# Patient Record
Sex: Male | Born: 1995 | Race: White | Hispanic: No | Marital: Single | Smoking: Never smoker
Health system: Southern US, Community
[De-identification: ages and names within clinical notes are randomized; demographics above are authoritative.]

## PROBLEM LIST (undated history)

## (undated) DIAGNOSIS — K4090 Unilateral inguinal hernia, without obstruction or gangrene, not specified as recurrent: Secondary | ICD-10-CM

## (undated) DIAGNOSIS — H539 Unspecified visual disturbance: Secondary | ICD-10-CM

## (undated) DIAGNOSIS — E7800 Pure hypercholesterolemia, unspecified: Secondary | ICD-10-CM

## (undated) DIAGNOSIS — T7840XA Allergy, unspecified, initial encounter: Secondary | ICD-10-CM

## (undated) DIAGNOSIS — K219 Gastro-esophageal reflux disease without esophagitis: Secondary | ICD-10-CM

---
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
## 2020-09-18 ENCOUNTER — Encounter: Payer: Self-pay | Admitting: Emergency Medicine

## 2020-09-18 ENCOUNTER — Other Ambulatory Visit: Payer: Self-pay

## 2020-09-18 ENCOUNTER — Ambulatory Visit
Admission: EM | Admit: 2020-09-18 | Discharge: 2020-09-18 | Disposition: A | Discharge status: Home / Self Care | Payer: BC Managed Care – PPO | Attending: Physician Assistant | Admitting: Physician Assistant

## 2020-09-18 ENCOUNTER — Ambulatory Visit (INDEPENDENT_AMBULATORY_CARE_PROVIDER_SITE_OTHER): Payer: BC Managed Care – PPO

## 2020-09-18 DIAGNOSIS — U071 COVID-19: Secondary | ICD-10-CM

## 2020-09-18 DIAGNOSIS — R059 Cough, unspecified: Secondary | ICD-10-CM | POA: Diagnosis not present

## 2020-09-18 DIAGNOSIS — R0602 Shortness of breath: Secondary | ICD-10-CM | POA: Diagnosis not present

## 2020-09-18 MED ORDER — ALBUTEROL SULFATE HFA 108 (90 BASE) MCG/ACT IN AERS: 1.0000 | INHALATION_SPRAY | Freq: Four times a day (QID) | RESPIRATORY_TRACT | 0 refills | Status: AC | PRN

## 2020-09-18 NOTE — ED Provider Notes (Signed)
[facility]    CSN: [id number] Arrival date & time: 09/18/20  1011      History   Chief Complaint Chief Complaint  Patient presents with  . Cough  . Covid Positive  . Shortness of Breath    HPI Christopher George is a 24 y.o. male (COVID unvaccinated) with confirmed Covid diagnosis on 09/11/2020.  Patient stated that he had been ill for "a couple of days" at that point.  Symptom onset estimated to be around 09/09/2020, which was 9 days ago.  Symptoms include cough and sensation or feeling of shortness of breath.  Patient also admits to fever up to 101 yesterday and today and states that he took ibuprofen before arrival to clinic.  He had been prescribed azithromycin, prednisone and Tessalon Perles at his initial office visit with his PCP when the diagnosis of Covid was made.  He states he took all the medication and still has symptoms.  Patient denies worsening of condition states he actually feels little bit better.  Patient does not have any cardiopulmonary disease or immunocompromising conditions.  He is healthy with medical history of anxiety and depression.  He currently takes Seroquel, Cymbalta and clonazepam.  Patient does not have any other complaints or concerns today.  HPI  Past Medical History:  Diagnosis Date  . Acid reflux    occasional  . Allergy   . High cholesterol    no current med.  . Inguinal hernia 11/2014   right  . Vision abnormalities    wears glasses    There are no problems to display for this patient.   Past Surgical History:  Procedure Laterality Date  . INGUINAL HERNIA REPAIR Right 12/05/2014   Procedure: RIGHT INGUINAL HERNIA REPAIR PEDIATRIC;  Surgeon: Judie Petit. Leonia Corona, MD;  Location: [facility];  Service: Pediatrics;  Laterality: Right;       Home Medications    Prior to Admission medications   Medication Sig Start Date End Date Taking? Authorizing Provider  clonazePAM (KLONOPIN) 0.5 MG tablet Take 0.5 mg by  mouth daily as needed. 09/18/20  Yes [provider]  QUEtiapine (SEROQUEL) 100 MG tablet Take 100 mg by mouth at bedtime. 08/19/20  Yes [provider]  HYDROcodone-acetaminophen (NORCO/VICODIN) 5-325 MG per tablet Take 1 tablet by mouth every 6 (six) hours as needed for moderate pain. 12/05/14   Leonia Corona, MD    Family History Family History  Problem Relation Age of Onset  . Heart disease Paternal Grandfather   . Antithrombin III deficiency Paternal Grandfather   . Hypertension Father   . Healthy Mother     Social History Social History   Tobacco Use  . Smoking status: Never Smoker  . Smokeless tobacco: Never Used  Vaping Use  . Vaping Use: Never used  Substance Use Topics  . Alcohol use: No  . Drug use: No     Allergies   Patient has no known allergies.   Review of Systems Review of Systems  Constitutional: Positive for fever. Negative for fatigue.  HENT: Positive for congestion. Negative for rhinorrhea, sinus pressure, sinus pain and sore throat.   Respiratory: Positive for cough and shortness of breath.   Gastrointestinal: Negative for abdominal pain, diarrhea, nausea and vomiting.  Musculoskeletal: Negative for myalgias.  Neurological: Negative for weakness, light-headedness and headaches.  Hematological: Negative for adenopathy.     Physical Exam Triage Vital Signs ED Triage Vitals [09/18/20 1037]  Enc Vitals Group  BP 138/73     Pulse Rate 84     Resp 18     Temp 98.2 F (36.8 C)     Temp Source Oral     SpO2 100 %     Weight 165 lb (74.8 kg)     Height 5\' 9"  (1.753 m)     Head Circumference      Peak Flow      Pain Score 0     Pain Loc      Pain Edu?      Excl. in GC?    No data found.  Updated Vital Signs BP 138/73 (BP Location: Left Arm)   Pulse 84   Temp 98.2 F (36.8 C) (Oral)   Resp 18   Ht 5\' 9"  (1.753 m)   Wt 165 lb (74.8 kg)   SpO2 100%   BMI 24.37 kg/m       Physical Exam Vitals and nursing  note reviewed.  Constitutional:      General: He is not in acute distress.    Appearance: Normal appearance. He is well-developed. He is not ill-appearing or toxic-appearing.  HENT:     Head: Normocephalic and atraumatic.     Nose: Nose normal.     Mouth/Throat:     Mouth: Mucous membranes are moist.     Pharynx: Oropharynx is clear.  Eyes:     General: No scleral icterus.    Conjunctiva/sclera: Conjunctivae normal.  Cardiovascular:     Rate and Rhythm: Normal rate and regular rhythm.     Heart sounds: Normal heart sounds.  Pulmonary:     Effort: Pulmonary effort is normal. No respiratory distress.     Breath sounds: Normal breath sounds. No wheezing, rhonchi or rales.  Musculoskeletal:     Cervical back: Neck supple.  Skin:    General: Skin is warm and dry.  Neurological:     General: No focal deficit present.     Mental Status: He is alert. Mental status is at baseline.     Motor: No weakness.     Gait: Gait normal.  Psychiatric:        Mood and Affect: Mood normal.        Behavior: Behavior normal.        Thought Content: Thought content normal.      UC Treatments / Results  Labs (all labs ordered are listed, but only abnormal results are displayed) Labs Reviewed - No data to display  EKG   Radiology DG Chest 2 View  Result Date: 09/18/2020 CLINICAL DATA:  Cough and shortness of breath. COVID-19 positive. EXAM: CHEST - 2 VIEW COMPARISON:  06/06/2008. FINDINGS: Normal sized heart. Clear lungs. Stable minimal peribronchial thickening. Normal appearing bones. IMPRESSION: Stable minimal bronchitic changes. Electronically Signed   By: Beckie SaltsSteven  Reid M.D.   On: 09/18/2020 10:55    Procedures Procedures (including critical care time)  Medications Ordered in UC Medications - No data to display  Initial Impression / Assessment and Plan / UC Course  I have reviewed the triage vital signs and the nursing notes.  Pertinent labs & imaging results that were available  during my care of the patient were reviewed by me and considered in my medical decision making (see chart for details).   Reviewed previous office notes from 09/11/2020 on patient tested positive for Covid.  All vital signs are stable today.  He is afebrile.  His chest is clear to auscultation.  He is not in  any acute respiratory distress.  Oxygen is 100%.  Chest x-ray obtained today due to patient concerns.  Chest x-ray does not reveal any signs of pneumonia.  There is some peribronchial thickening.  Reviewed chest x-ray myself.  Discussed results of imaging with patient and advised him that it may take another week or 2 for him to have resolved symptoms.  Advised him to continue Mucinex and Tessalon Perles if needed.  Advised increasing rest and fluid intake.  You may do the albuterol inhaler as needed for any shortness of breath.  ED precautions discussed.  Follow-up with PCP otherwise.   Final Clinical Impressions(s) / UC Diagnoses   Final diagnoses:  COVID-19  Cough  Shortness of breath     Discharge Instructions     You need to isolate another 2 days for Covid infection.  We did take a chest x-ray today which does not show any signs of pneumonia.  Use the inhaler as needed for any shortness of breath.  Purchase over-the-counter Mucinex and increase fluid intake.  Go to emergency room for any fever, worsening cough or worsening breathing difficulty.  And still may take another week or 2 for you to recover.  Sometimes, it takes longer than that.  However, if any symptoms worsen then he should be seen again, otherwise you should get better with some more time.  COVID INFO:  If symptomatic, go home and rest. Push fluids. Take Tylenol as needed for discomfort. Gargle warm salt water. Throat lozenges. Take Mucinex DM or Robitussin for cough. Humidifier in bedroom to ease coughing. Warm showers. Also review the COVID handout for more information.  COVID-19 INFECTION: The incubation period  of COVID-19 is approximately 14 days after exposure, with most symptoms developing in roughly 4-5 days. Symptoms may range in severity from mild to critically severe. Roughly 80% of those infected will have mild symptoms. People of any age may become infected with COVID-19 and have the ability to transmit the virus. The most common symptoms include: fever, fatigue, cough, body aches, headaches, sore throat, nasal congestion, shortness of breath, nausea, vomiting, diarrhea, changes in smell and/or taste.    COURSE OF ILLNESS Some patients may begin with mild disease which can progress quickly into critical symptoms. If your symptoms are worsening please call ahead to the Emergency Department and proceed there for further treatment. Recovery time appears to be roughly 1-2 weeks for mild symptoms and 3-6 weeks for severe disease.   GO IMMEDIATELY TO ER FOR FEVER YOU ARE UNABLE TO GET DOWN WITH TYLENOL, BREATHING PROBLEMS, CHEST PAIN, FATIGUE, LETHARGY, INABILITY TO EAT OR DRINK, ETC  QUARANTINE AND ISOLATION: To help decrease the spread of COVID-19 please remain isolated if you have COVID infection or are highly suspected to have COVID infection. This means -stay home and isolate to one room in the home if you live with others. Do not share a bed or bathroom with others while ill, sanitize and wipe down all countertops and keep common areas clean and disinfected. You may discontinue isolation if you have a mild case and are asymptomatic 10 days after symptom onset as long as you have been fever free >24 hours without having to take Motrin or Tylenol. If your case is more severe (meaning you develop pneumonia or are admitted in the hospital), you may have to isolate longer.   If you have been in close contact (within 6 feet) of someone diagnosed with COVID 19, you are advised to quarantine in your home for  14 days as symptoms can develop anywhere from 2-14 days after exposure to the virus. If you develop  symptoms, you  must isolate.  Most current guidelines for COVID after exposure -isolate 10 days if you ARE NOT tested for COVID as long as symptoms do not develop -isolate 7 days if you are tested and remain asymptomatic -You do not necessarily need to be tested for COVID if you have + exposure and        develop   symptoms. Just isolate at home x10 days from symptom onset During this global pandemic, CDC advises to practice social distancing, try to stay at least 19ft away from others at all times. Wear a face covering. Wash and sanitize your hands regularly and avoid going anywhere that is not necessary.  KEEP IN MIND THAT THE COVID TEST IS NOT 100% ACCURATE AND YOU SHOULD STILL DO EVERYTHING TO PREVENT POTENTIAL SPREAD OF VIRUS TO OTHERS (WEAR MASK, WEAR GLOVES, WASH HANDS AND SANITIZE REGULARLY). IF INITIAL TEST IS NEGATIVE, THIS MAY NOT MEAN YOU ARE DEFINITELY NEGATIVE. MOST ACCURATE TESTING IS DONE 5-7 DAYS AFTER EXPOSURE.   It is not advised by CDC to get re-tested after receiving a positive COVID test since you can still test positive for weeks to months after you have already cleared the virus.   *If you have not been vaccinated for COVID, I strongly suggest you consider getting vaccinated as long as there are no contraindications.      ED Prescriptions    None     PDMP not reviewed this encounter.   Shirlee Latch, PA-C 09/18/20 1115

## 2020-09-18 NOTE — Discharge Instructions (Addendum)
You need to isolate another 2 days for Covid infection.  We did take a chest x-ray today which does not show any signs of pneumonia.  Use the inhaler as needed for any shortness of breath.  Purchase over-the-counter Mucinex and increase fluid intake.  Go to emergency room for any fever, worsening cough or worsening breathing difficulty.  And still may take another week or 2 for you to recover.  Sometimes, it takes longer than that.  However, if any symptoms worsen then he should be seen again, otherwise you should get better with some more time.  COVID INFO:  If symptomatic, go home and rest. Push fluids. Take Tylenol as needed for discomfort. Gargle warm salt water. Throat lozenges. Take Mucinex DM or Robitussin for cough. Humidifier in bedroom to ease coughing. Warm showers. Also review the COVID handout for more information.  COVID-19 INFECTION: The incubation period of COVID-19 is approximately 14 days after exposure, with most symptoms developing in roughly 4-5 days. Symptoms may range in severity from mild to critically severe. Roughly 80% of those infected will have mild symptoms. People of any age may become infected with COVID-19 and have the ability to transmit the virus. The most common symptoms include: fever, fatigue, cough, body aches, headaches, sore throat, nasal congestion, shortness of breath, nausea, vomiting, diarrhea, changes in smell and/or taste.    COURSE OF ILLNESS Some patients may begin with mild disease which can progress quickly into critical symptoms. If your symptoms are worsening please call ahead to the Emergency Department and proceed there for further treatment. Recovery time appears to be roughly 1-2 weeks for mild symptoms and 3-6 weeks for severe disease.   GO IMMEDIATELY TO ER FOR FEVER YOU ARE UNABLE TO GET DOWN WITH TYLENOL, BREATHING PROBLEMS, CHEST PAIN, FATIGUE, LETHARGY, INABILITY TO EAT OR DRINK, ETC  QUARANTINE AND ISOLATION: To help decrease the spread of  COVID-19 please remain isolated if you have COVID infection or are highly suspected to have COVID infection. This means -stay home and isolate to one room in the home if you live with others. Do not share a bed or bathroom with others while ill, sanitize and wipe down all countertops and keep common areas clean and disinfected. You may discontinue isolation if you have a mild case and are asymptomatic 10 days after symptom onset as long as you have been fever free >24 hours without having to take Motrin or Tylenol. If your case is more severe (meaning you develop pneumonia or are admitted in the hospital), you may have to isolate longer.   If you have been in close contact (within 6 feet) of someone diagnosed with COVID 19, you are advised to quarantine in your home for 14 days as symptoms can develop anywhere from 2-14 days after exposure to the virus. If you develop symptoms, you  must isolate.  Most current guidelines for COVID after exposure -isolate 10 days if you ARE NOT tested for COVID as long as symptoms do not develop -isolate 7 days if you are tested and remain asymptomatic -You do not necessarily need to be tested for COVID if you have + exposure and        develop   symptoms. Just isolate at home x10 days from symptom onset During this global pandemic, CDC advises to practice social distancing, try to stay at least 32ft away from others at all times. Wear a face covering. Wash and sanitize your hands regularly and avoid going anywhere that is not  necessary.  KEEP IN MIND THAT THE COVID TEST IS NOT 100% ACCURATE AND YOU SHOULD STILL DO EVERYTHING TO PREVENT POTENTIAL SPREAD OF VIRUS TO OTHERS (WEAR MASK, WEAR GLOVES, WASH HANDS AND SANITIZE REGULARLY). IF INITIAL TEST IS NEGATIVE, THIS MAY NOT MEAN YOU ARE DEFINITELY NEGATIVE. MOST ACCURATE TESTING IS DONE 5-7 DAYS AFTER EXPOSURE.   It is not advised by CDC to get re-tested after receiving a positive COVID test since you can still test positive  for weeks to months after you have already cleared the virus.   *If you have not been vaccinated for COVID, I strongly suggest you consider getting vaccinated as long as there are no contraindications.

## 2020-09-18 NOTE — ED Triage Notes (Signed)
Patient in today c/o cough and sob. Patient tested positive for covid on 09/11/20. Patient seen at [facility] on 09/11/20 and had a positive rapid covid test. Patient was treated for bronchitis with a Zpak, Prednisone and Tessalon Perles. Patient did not have a CXR at that time.

## 2021-06-03 ENCOUNTER — Ambulatory Visit: Payer: BC Managed Care – PPO | Admitting: Behavioral Health

## 2021-06-03 ENCOUNTER — Other Ambulatory Visit: Payer: Self-pay

## 2021-06-03 ENCOUNTER — Encounter: Payer: Self-pay | Admitting: Behavioral Health

## 2021-06-03 VITALS — BP 121/76 | HR 65 | Ht 68.0 in | Wt 156.0 lb

## 2021-06-03 DIAGNOSIS — F331 Major depressive disorder, recurrent, moderate: Secondary | ICD-10-CM | POA: Diagnosis not present

## 2021-06-03 DIAGNOSIS — F39 Unspecified mood [affective] disorder: Secondary | ICD-10-CM

## 2021-06-03 DIAGNOSIS — F411 Generalized anxiety disorder: Secondary | ICD-10-CM | POA: Diagnosis not present

## 2021-06-03 MED ORDER — LAMOTRIGINE 25 MG PO TABS: ORAL_TABLET | ORAL | 1 refills | Status: AC

## 2021-06-03 MED ORDER — SERTRALINE HCL 100 MG PO TABS: 100.0000 mg | ORAL_TABLET | Freq: Every day | ORAL | 1 refills | Status: DC

## 2021-06-03 NOTE — Progress Notes (Signed)
[facility] MD/PA/NP Initial Note  06/03/2021 12:43 PM SAUNDERS ARLINGTON  MRN:  [id number]  Chief Complaint:  Chief Complaint   Anxiety; Depression; Establish Care; Medication Problem; Medication Refill; Fatigue     HPI:  25 year old male presents to this office for initial visit and to establish care. He says that for the last 3-4 years has experienced lack of motivation, fatigue, anxiety, and depression. Says that he has bounced between providers and specialist and changed medications many times for these symptoms. Says he has seen PCP, Psychiatrist, Tele-psychiatry, cardiology, and neurology.  He says that he has been diagnosed with chronic nerve pain and is currently on high doses of gabapentin. He also d frequently takes multiple doses of Klonopin concurrently with gabapentin. Says his PCP restarted Zoloft 50 mg because he has switched around on so many medications many times without adequate time frames to see if medication worked. Pt acknowledges that he sometimes would stop medications without guidance from clinician.  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 Worries about the "brain fog" and reduced memory problems.  Says he his always fatigued and has trouble concentrating. Says he feels his moods, "kinda all over the place". He endorses frequent irritability, and racing thoughts.  Says that he has had periods where he was impulsive. Recently says he made purchase of new car when he had his current car paid off, and he really did not need it. Says that at same time was co-purchasing another car with Fiance. Says that he has experience mood swings with highs and lows but feels he stays mostly depressed. Reports anxiety today at 5/10 and depression at 4/10. Says he does sleeps 7-8 hours with aid of his medication. No mania present at this time. No psychosis. No SI/HI  Past Psychiatric Medication  trials:  Wellbutrin Sertraline Dextroamphetamine Strattera Duloxetine Seroquel Abilify Amoxetine Depakote Hydroxyzine Prozac Temazepam  Buspar      Visit Diagnosis:    ICD-10-CM   1. Generalized anxiety disorder  F41.1 lamoTRIgine (LAMICTAL) 25 MG tablet    sertraline (ZOLOFT) 100 MG tablet    2. Major depressive disorder, recurrent episode, moderate (HCC)  F33.1 lamoTRIgine (LAMICTAL) 25 MG tablet    sertraline (ZOLOFT) 100 MG tablet    3. Unspecified mood (affective) disorder (HCC)  F39 lamoTRIgine (LAMICTAL) 25 MG tablet      Past Psychiatric History: Multiple providers over last 3 years. See note  Past Medical History:  Past Medical History:  Diagnosis Date   Acid reflux    occasional   Allergy    High cholesterol    no current med.   Inguinal hernia 11/2014   right   Vision abnormalities    wears glasses    Past Surgical History:  Procedure Laterality Date   INGUINAL HERNIA REPAIR Right 12/05/2014   Procedure: RIGHT INGUINAL HERNIA REPAIR PEDIATRIC;  Surgeon: Judie Petit. Leonia Corona, MD;  Location: [facility];  Service: Pediatrics;  Laterality: Right;    Family Psychiatric History: none noted this visit  Family History:  Family History  Problem Relation Age of Onset   Heart disease Paternal Grandfather    Antithrombin III deficiency Paternal Grandfather    Hypertension Father    Healthy Mother     Social History:  Social History   Socioeconomic History   Marital status: Single    Spouse name: Not on file   Number of children: Not on file  Years of education: 71   Highest education level: Associate degree: academic program  Occupational History   Occupation: Education officer, environmental    Comment: Designer, fashion/clothing in [place]  Tobacco Use   Smoking status: Never   Smokeless tobacco: Never  Vaping Use   Vaping Use: Never used  Substance and Sexual Activity   Alcohol use: No   Drug use: No   Sexual activity: Never  Other Topics Concern    Not on file  Social History Narrative   Currently [employer] church in [place].  Lives with parents until parsonage is ready. Engaged and planning    On getting married in November.    Social Determinants of Health   Financial Resource Strain: Not on file  Food Insecurity: Not on file  Transportation Needs: Not on file  Physical Activity: Not on file  Stress: Not on file  Social Connections: Not on file    Allergies: No Known Allergies  Metabolic Disorder Labs: No results found for: HGBA1C, MPG No results found for: PROLACTIN No results found for: CHOL, TRIG, HDL, CHOLHDL, VLDL, LDLCALC No results found for: TSH  Therapeutic Level Labs: No results found for: LITHIUM No results found for: VALPROATE No components found for:  CBMZ  Current Medications: Current Outpatient Medications  Medication Sig Dispense Refill   clonazePAM (KLONOPIN) 0.5 MG tablet Take 1 tablet by mouth 2 (two) times daily as needed.     gabapentin (NEURONTIN) 100 MG capsule Take by mouth.     gabapentin (NEURONTIN) 300 MG capsule Take 300 mg by mouth 2 (two) times daily.     gabapentin (NEURONTIN) 400 MG capsule Take by mouth.     lamoTRIgine (LAMICTAL) 25 MG tablet Take one tablet 25 mg for 14 days, then take two tablets 50 mg daily. 60 tablet 1   sertraline (ZOLOFT) 100 MG tablet Take 1 tablet (100 mg total) by mouth daily. 30 tablet 1   sertraline (ZOLOFT) 50 MG tablet Take 50 mg by mouth daily.     albuterol (VENTOLIN HFA) 108 (90 Base) MCG/ACT inhaler Inhale 1-2 puffs into the lungs every 6 (six) hours as needed for up to 10 days for wheezing or shortness of breath. 1 g 0   No current facility-administered medications for this visit.    Medication Side Effects: none  Orders placed this visit:  No orders of the defined types were placed in this encounter.   Psychiatric Specialty Exam:  Review of Systems  Blood pressure 121/76, pulse 65, height 5\' 8"  (1.727 m), weight 156 lb (70.8  kg).Body mass index is 23.72 kg/m.  General Appearance: Casual, Neat, and Well Groomed  Eye Contact:  Good  Speech:  Clear and Coherent, Slow, and Talkative  Volume:  Decreased  Mood:  Depressed and Dysphoric  Affect:  Appropriate  Thought Process:  Coherent  Orientation:  Full (Time, Place, and Person)  Thought Content: Logical   Suicidal Thoughts:  No  Homicidal Thoughts:  No  Memory:  WNL  Judgement:  Good  Insight:  Good  Psychomotor Activity:  Decreased  Concentration:  Concentration: Fair  Recall:  Good  Fund of Knowledge: Fair  Language: Good  Assets:  Desire for Improvement  ADL's:  Intact  Cognition: WNL  Prognosis:  Good   Screenings:  PHQ2-9    Flowsheet Row Office Visit from 06/03/2021 in [facility]  PHQ-2 Total Score 1       Receiving Psychotherapy: No   Treatment Plan/Recommendations:  Increase Zoloft  to 100 mg daily To Start Lamictal 25 mg for 14 days then 50 mg daily Reduce Gabapentin to 400 mg three times daily until next follow up. Continue Klonopin 0.5 mg twice daily prn Will report any worsening symptoms or side effects Will follow up in 4 weeks to reassess. Greater than 50% of 60 min face to face time with patient was spent on counseling and coordination of care. We discussed his complex history of anxiety, depression, and fatigue stemming back several years. He has had consult with multiple providers to include PCP, Psychiatry, Tele-psychiatry, Cardiology, Neurology. Multiple psychiatric medication trials. We discussed allowing adequate time frame for medications to work. I discussed my clinical judgement that his high doses of Gabapentin combined with Klonopin can be causing his symptoms of mental fog and fatigue. This is also complicated by his treatment for nerve pain. Also discussed with patient working diagnosis of mood disorder. Explained to patient we cannot rule out Bipolar and he is at age where symptoms can begin to present.  Discussed that we would further assess behaviors, symptoms, and responses to medication over time. It is very important that he is compliant for accurate diagnosis.  Monitor for any sign of rash. Please taking Lamictal and contact office immediately rash develops. Recommend seeking urgent medical attention if rash is severe and/or spreading quickly. Recommend psychotherapy and psych testing. PDMP reviewed    Joan Flores, NP

## 2021-06-26 ENCOUNTER — Other Ambulatory Visit: Payer: Self-pay | Admitting: Behavioral Health

## 2021-06-26 DIAGNOSIS — F39 Unspecified mood [affective] disorder: Secondary | ICD-10-CM

## 2021-06-26 DIAGNOSIS — F331 Major depressive disorder, recurrent, moderate: Secondary | ICD-10-CM

## 2021-06-26 DIAGNOSIS — F411 Generalized anxiety disorder: Secondary | ICD-10-CM

## 2021-06-28 ENCOUNTER — Other Ambulatory Visit: Payer: Self-pay | Admitting: Behavioral Health

## 2021-06-28 DIAGNOSIS — F411 Generalized anxiety disorder: Secondary | ICD-10-CM

## 2021-06-28 DIAGNOSIS — F331 Major depressive disorder, recurrent, moderate: Secondary | ICD-10-CM

## 2021-07-01 ENCOUNTER — Ambulatory Visit: Payer: BC Managed Care – PPO | Admitting: Behavioral Health

## 2021-07-01 ENCOUNTER — Other Ambulatory Visit: Payer: Self-pay

## 2021-07-01 ENCOUNTER — Encounter: Payer: Self-pay | Admitting: Behavioral Health

## 2021-07-01 DIAGNOSIS — F331 Major depressive disorder, recurrent, moderate: Secondary | ICD-10-CM | POA: Diagnosis not present

## 2021-07-01 DIAGNOSIS — F5105 Insomnia due to other mental disorder: Secondary | ICD-10-CM

## 2021-07-01 DIAGNOSIS — F411 Generalized anxiety disorder: Secondary | ICD-10-CM | POA: Diagnosis not present

## 2021-07-01 DIAGNOSIS — F39 Unspecified mood [affective] disorder: Secondary | ICD-10-CM

## 2021-07-01 DIAGNOSIS — F99 Mental disorder, not otherwise specified: Secondary | ICD-10-CM

## 2021-07-01 MED ORDER — GABAPENTIN 300 MG PO CAPS: 300.0000 mg | ORAL_CAPSULE | Freq: Two times a day (BID) | ORAL | 0 refills | Status: AC

## 2021-07-01 MED ORDER — SERTRALINE HCL 100 MG PO TABS: ORAL_TABLET | ORAL | 3 refills | Status: DC

## 2021-07-01 MED ORDER — CLONAZEPAM 0.5 MG PO TABS: 0.5000 mg | ORAL_TABLET | Freq: Two times a day (BID) | ORAL | 1 refills | Status: DC | PRN

## 2021-07-01 MED ORDER — LAMOTRIGINE 100 MG PO TABS: 100.0000 mg | ORAL_TABLET | Freq: Every day | ORAL | 2 refills | Status: DC

## 2021-07-01 MED ORDER — TRAZODONE HCL 50 MG PO TABS: ORAL_TABLET | ORAL | 2 refills | Status: DC

## 2021-07-01 NOTE — Progress Notes (Signed)
[facility]  Patient ID: NEITHAN DAY,  MRN: [id number]  PCP: Lauro Regulus, MD  Date of Evaluation: 07/01/2021 Time spent:30 minutes  Chief Complaint:  Chief Complaint   Anxiety; Depression; Insomnia; Follow-up; Medication Refill; Medication Problem     HISTORY/CURRENT STATUS: HPI 25 year old patient presents to this office for follow up and medication management. He says that he does not notice very much difference since increasing dosage of Zoloft and Lamictal. He says he has notice modest improvements in reported mental fog by reducing continuing to reduce Gabapentin and Klonopin. Says he still notices his mood decline more in the late afternoon or evenings. Says he experiences regular highs and lows on almost daily basis. 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 He says anxiety today is a 4/10 and depression 0/10. Says he still struggle with going and staying asleep. Says he averages about 6-7 total hours per night.  No mania, no psychosis, no SI/HI.  Past Psychiatric Medication trials:   Wellbutrin Sertraline Dextroamphetamine Strattera Duloxetine Seroquel Abilify Amoxetine Depakote Hydroxyzine Prozac Temazepam  Buspar   Individual Medical History/ Review of Systems: Changes? :No   Allergies: Patient has no known allergies.  Current Medications:  Current Outpatient Medications:    gabapentin (NEURONTIN) 300 MG capsule, Take 1 capsule (300 mg total) by mouth 2 (two) times daily., Disp: 60 capsule, Rfl: 0   lamoTRIgine (LAMICTAL) 100 MG tablet, Take 1 tablet (100 mg total) by mouth daily., Disp: 30 tablet, Rfl: 2   sertraline (ZOLOFT) 100 MG tablet, Take 1.5 tablets by mouth daily., Disp: 45 tablet, Rfl: 3   traZODone (DESYREL) 50 MG tablet, Take 1/2 tablet 25 mg at bedtime., Disp: 30 tablet, Rfl: 2   albuterol (VENTOLIN HFA) 108 (90 Base) MCG/ACT inhaler, Inhale 1-2 puffs into the  lungs every 6 (six) hours as needed for up to 10 days for wheezing or shortness of breath., Disp: 1 g, Rfl: 0   clonazePAM (KLONOPIN) 0.5 MG tablet, Take 1 tablet (0.5 mg total) by mouth 2 (two) times daily as needed., Disp: 60 tablet, Rfl: 1   gabapentin (NEURONTIN) 100 MG capsule, Take by mouth., Disp: , Rfl:    gabapentin (NEURONTIN) 300 MG capsule, Take 300 mg by mouth 2 (two) times daily., Disp: , Rfl:    lamoTRIgine (LAMICTAL) 25 MG tablet, Take one tablet 25 mg for 14 days, then take two tablets 50 mg daily., Disp: 60 tablet, Rfl: 1 Medication Side Effects: none  Family Medical/ Social History: Changes? No  MENTAL HEALTH EXAM:  There were no vitals taken for this visit.There is no height or weight on file to calculate BMI.  General Appearance: Casual, Neat, and Well Groomed  Eye Contact:  Good  Speech:  Clear and Coherent and Slow  Volume:  Decreased  Mood:  Dysphoric  Affect:  Flat  Thought Process:  Coherent  Orientation:  Full (Time, Place, and Person)  Thought Content: Logical   Suicidal Thoughts:  No  Homicidal Thoughts:  No  Memory:  WNL  Judgement:  Good  Insight:  Good  Psychomotor Activity:  Decreased  Concentration:  Concentration: Good  Recall:  Good  Fund of Knowledge: Good  Language: Good  Assets:  Desire for Improvement  ADL's:  Intact  Cognition: WNL  Prognosis:  Fair    DIAGNOSES:    ICD-10-CM   1. Generalized anxiety disorder  F41.1 gabapentin (NEURONTIN) 300 MG capsule  sertraline (ZOLOFT) 100 MG tablet    lamoTRIgine (LAMICTAL) 100 MG tablet    clonazePAM (KLONOPIN) 0.5 MG tablet    2. Major depressive disorder, recurrent episode, moderate (HCC)  F33.1 gabapentin (NEURONTIN) 300 MG capsule    sertraline (ZOLOFT) 100 MG tablet    lamoTRIgine (LAMICTAL) 100 MG tablet    3. Unspecified mood (affective) disorder (HCC)  F39 gabapentin (NEURONTIN) 300 MG capsule    sertraline (ZOLOFT) 100 MG tablet    lamoTRIgine (LAMICTAL) 100 MG tablet    4.  Insomnia due to other mental disorder  F51.05 traZODone (DESYREL) 50 MG tablet   F99       Receiving Psychotherapy: No    RECOMMENDATIONS:   Increase Zoloft to 150 mg daily Increase Lamictal to 100 mg daily Reduce Gabapentin to 300 mg 2  times daily until next follow up. Continue Klonopin 0.5 mg twice daily prn Will report any worsening symptoms or side effects Will follow up in 2 months to reassess. Greater than 50% of 30  min face to face time with patient was spent on counseling and coordination of care. We reviewed his complex history of anxiety, depression, and fatigue stemming back several years. He has had consult with multiple providers to include PCP, Psychiatry, Tele-psychiatry, Cardiology, Neurology. Multiple psychiatric medication trials. We discussed allowing adequate time frame for medications to work. I reinforced my clinical judgement that his Rx of Gabapentin combined with Klonopin can be causing his symptoms of mental fog and fatigue. He continues to agree with gradual reduction of Gabapentin and Klonopin.  Continue to not rule out DX of Bipolar disorder. Discussed that we would further assess behaviors, symptoms, and responses to medication over time. It is very important that he is compliant for accurate diagnosis.  Monitor for any sign of rash. Please taking Lamictal and contact office immediately rash develops. Recommend seeking urgent medical attention if rash is severe and/or spreading quickly. Recommend psychotherapy and psych testing. PDMP reviewed    Joan Flores, NP

## 2021-07-23 ENCOUNTER — Other Ambulatory Visit: Payer: Self-pay | Admitting: Behavioral Health

## 2021-07-23 DIAGNOSIS — F39 Unspecified mood [affective] disorder: Secondary | ICD-10-CM

## 2021-07-23 DIAGNOSIS — F411 Generalized anxiety disorder: Secondary | ICD-10-CM

## 2021-07-23 DIAGNOSIS — F331 Major depressive disorder, recurrent, moderate: Secondary | ICD-10-CM

## 2021-07-25 ENCOUNTER — Other Ambulatory Visit: Payer: Self-pay | Admitting: Behavioral Health

## 2021-07-25 DIAGNOSIS — F39 Unspecified mood [affective] disorder: Secondary | ICD-10-CM

## 2021-07-25 DIAGNOSIS — F331 Major depressive disorder, recurrent, moderate: Secondary | ICD-10-CM

## 2021-07-25 DIAGNOSIS — F411 Generalized anxiety disorder: Secondary | ICD-10-CM

## 2021-08-12 ENCOUNTER — Telehealth: Payer: Self-pay | Admitting: Behavioral Health

## 2021-08-12 ENCOUNTER — Other Ambulatory Visit: Payer: Self-pay | Admitting: Behavioral Health

## 2021-08-12 DIAGNOSIS — F39 Unspecified mood [affective] disorder: Secondary | ICD-10-CM

## 2021-08-12 DIAGNOSIS — F5105 Insomnia due to other mental disorder: Secondary | ICD-10-CM

## 2021-08-12 DIAGNOSIS — F411 Generalized anxiety disorder: Secondary | ICD-10-CM

## 2021-08-12 DIAGNOSIS — F99 Mental disorder, not otherwise specified: Secondary | ICD-10-CM

## 2021-08-12 DIAGNOSIS — F331 Major depressive disorder, recurrent, moderate: Secondary | ICD-10-CM

## 2021-08-12 MED ORDER — GABAPENTIN 100 MG PO CAPS: 200.0000 mg | ORAL_CAPSULE | Freq: Two times a day (BID) | ORAL | 2 refills | Status: DC

## 2021-08-12 NOTE — Telephone Encounter (Signed)
Christopher George called to request refill of his gabapentin.  From last visit he said he was to be going down on the dose to 200mg  2/day.  [facility] [address] [facility] [place]  [facility] of [facility] list.  He has appt 10/19

## 2021-08-12 NOTE — Telephone Encounter (Signed)
Escribed  #120, Gabapentin 100 mg tablet. Take two tablets (200 mg) twice daily. No further action needed.

## 2021-08-12 NOTE — Telephone Encounter (Signed)
Please review

## 2021-09-02 ENCOUNTER — Ambulatory Visit: Payer: BC Managed Care – PPO | Admitting: Behavioral Health

## 2021-09-02 ENCOUNTER — Telehealth: Payer: Self-pay | Admitting: Behavioral Health

## 2021-09-02 ENCOUNTER — Other Ambulatory Visit: Payer: Self-pay

## 2021-09-02 DIAGNOSIS — F411 Generalized anxiety disorder: Secondary | ICD-10-CM

## 2021-09-02 MED ORDER — CLONAZEPAM 0.5 MG PO TABS: 0.5000 mg | ORAL_TABLET | Freq: Two times a day (BID) | ORAL | 0 refills | Status: DC | PRN

## 2021-09-02 NOTE — Telephone Encounter (Signed)
Klonopin pended,other meds have refills

## 2021-09-02 NOTE — Telephone Encounter (Signed)
Patient called in RS appt today due to an emergency. His next appt is 11/1. Christopher George stated that he may needs refills on prescriptions Gabapentin 100mg , Lamotrigine 100mg , and Clonazepam 0.5mg   He's not sure how much he has left, but would like to go ahead and get filled before appt. Ph: [phone]. [facility] [address] [place]

## 2021-09-15 ENCOUNTER — Ambulatory Visit: Payer: BC Managed Care – PPO | Admitting: Behavioral Health

## 2021-09-26 ENCOUNTER — Other Ambulatory Visit: Payer: Self-pay | Admitting: Behavioral Health

## 2021-09-26 DIAGNOSIS — F39 Unspecified mood [affective] disorder: Secondary | ICD-10-CM

## 2021-09-26 DIAGNOSIS — F411 Generalized anxiety disorder: Secondary | ICD-10-CM

## 2021-09-26 DIAGNOSIS — F331 Major depressive disorder, recurrent, moderate: Secondary | ICD-10-CM

## 2021-10-01 ENCOUNTER — Encounter: Payer: Self-pay | Admitting: Behavioral Health

## 2021-10-01 ENCOUNTER — Ambulatory Visit (INDEPENDENT_AMBULATORY_CARE_PROVIDER_SITE_OTHER): Payer: BC Managed Care – PPO | Admitting: Behavioral Health

## 2021-10-01 DIAGNOSIS — F39 Unspecified mood [affective] disorder: Secondary | ICD-10-CM

## 2021-10-01 DIAGNOSIS — F411 Generalized anxiety disorder: Secondary | ICD-10-CM

## 2021-10-01 DIAGNOSIS — F331 Major depressive disorder, recurrent, moderate: Secondary | ICD-10-CM | POA: Diagnosis not present

## 2021-10-01 NOTE — Progress Notes (Deleted)
Christopher George [id number] 07-Nov-1996 25 y.o.  Virtual Visit via Telephone Note  I connected with pt on 10/01/21 at  2:30 PM EST by telephone and verified that I am speaking with the correct person using two identifiers.   I discussed the limitations, risks, security and privacy concerns of performing an evaluation and management service by telephone and the availability of in person appointments. I also discussed with the patient that there may be a patient responsible charge related to this service. The patient expressed understanding and agreed to proceed.   I discussed the assessment and treatment plan with the patient. The patient was provided an opportunity to ask questions and all were answered. The patient agreed with the plan and demonstrated an understanding of the instructions.   The patient was advised to call back or seek an in-person evaluation if the symptoms worsen or if the condition fails to improve as anticipated.  I provided 20 minutes of non-face-to-face time during this encounter.  The patient was located at home.  The provider was located at [facility].   Joan Flores, NP   Subjective:   Patient ID:  Christopher George is a 25 y.o. (DOB July 11, 1996) male.  Chief Complaint:  Chief Complaint  Patient presents with   Depression   Anxiety   Medication Refill   Follow-up    HPI RYMAN RATHGEBER presents for follow-up of ***   Review of Systems:  Review of Systems  Medications: {medication reviewed/display:3041432}  Current Outpatient Medications  Medication Sig Dispense Refill   albuterol (VENTOLIN HFA) 108 (90 Base) MCG/ACT inhaler Inhale 1-2 puffs into the lungs every 6 (six) hours as needed for up to 10 days for wheezing or shortness of breath. 1 g 0   clonazePAM (KLONOPIN) 0.5 MG tablet Take 1 tablet (0.5 mg total) by mouth 2 (two) times daily as needed. 60 tablet 0   gabapentin (NEURONTIN) 100 MG capsule Take 2 capsules (200 mg total) by mouth 2  (two) times daily. 120 capsule 2   gabapentin (NEURONTIN) 300 MG capsule Take 1 capsule (300 mg total) by mouth 2 (two) times daily. 60 capsule 0   lamoTRIgine (LAMICTAL) 100 MG tablet TAKE 1 TABLET BY MOUTH EVERY DAY 90 tablet 0   lamoTRIgine (LAMICTAL) 25 MG tablet Take one tablet 25 mg for 14 days, then take two tablets 50 mg daily. 60 tablet 1   sertraline (ZOLOFT) 100 MG tablet TAKE 1 AND 1/2 TABLETS BY MOUTH EVERY DAY 45 tablet 0   traZODone (DESYREL) 50 MG tablet Take 1/2 tablet 25 mg at bedtime. 30 tablet 2   No current facility-administered medications for this visit.    Medication Side Effects: {Medication Side Effects (Optional):21014029}  Allergies: No Known Allergies  Past Medical History:  Diagnosis Date   Acid reflux    occasional   Allergy    High cholesterol    no current med.   Inguinal hernia 11/2014   right   Vision abnormalities    wears glasses    Family History  Problem Relation Age of Onset   Heart disease Paternal Grandfather    Antithrombin III deficiency Paternal Grandfather    Hypertension Father    Healthy Mother     Social History   Socioeconomic History   Marital status: Single    Spouse name: Not on file   Number of children: Not on file   Years of education: 16   Highest education level: Associate degree: academic program  Occupational History   Occupation: Education officer, environmental    CommentTeacher, adult education in [place]  Tobacco Use   Smoking status: Never   Smokeless tobacco: Never  Vaping Use   Vaping Use: Never used  Substance and Sexual Activity   Alcohol use: No   Drug use: No   Sexual activity: Never  Other Topics Concern   Not on file  Social History Narrative   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  Lives with parents until parsonage is ready. Engaged and planning    On getting married in November.    Social Determinants of Health   Financial Resource Strain: Not on file  Food Insecurity: Not on file  Transportation  Needs: Not on file  Physical Activity: Not on file  Stress: Not on file  Social Connections: Not on file  Intimate Partner Violence: Not on file    Past Medical History, Surgical history, Social history, and Family history were reviewed and updated as appropriate.   Please see review of systems for further details on the patient's review from today.   Objective:   Physical Exam:  There were no vitals taken for this visit.  Physical Exam  Lab Review:  No results found for: NA, K, CL, CO2, GLUCOSE, BUN, CREATININE, CALCIUM, PROT, ALBUMIN, AST, ALT, ALKPHOS, BILITOT, GFRNONAA, GFRAA  No results found for: WBC, RBC, HGB, HCT, PLT, MCV, MCH, MCHC, RDW, LYMPHSABS, MONOABS, EOSABS, BASOSABS  No results found for: POCLITH, LITHIUM   No results found for: PHENYTOIN, PHENOBARB, VALPROATE, CBMZ   .res Assessment: Plan:    Curt was seen today for depression, anxiety, medication refill and follow-up.  Diagnoses and all orders for this visit:  Generalized anxiety disorder  Major depressive disorder, recurrent episode, moderate (HCC)  Unspecified mood (affective) disorder (HCC)    Please see After Visit Summary for patient specific instructions.  No future appointments.  No orders of the defined types were placed in this encounter.     -------------------------------

## 2021-10-01 NOTE — Progress Notes (Signed)
Christopher George [id number] 07-14-1996 25 y.o.  Virtual Visit via Telephone Note  I connected with pt on 10/01/21 at  2:30 PM EST by telephone and verified that I am speaking with the correct person using two identifiers.   I discussed the limitations, risks, security and privacy concerns of performing an evaluation and management service by telephone and the availability of in person appointments. I also discussed with the patient that there may be a patient responsible charge related to this service. The patient expressed understanding and agreed to proceed.   I discussed the assessment and treatment plan with the patient. The patient was provided an opportunity to ask questions and all were answered. The patient agreed with the plan and demonstrated an understanding of the instructions.   The patient was advised to call back or seek an in-person evaluation if the symptoms worsen or if the condition fails to improve as anticipated.  I provided 20  minutes of non-face-to-face time during this encounter.  The patient was located at home.  The provider was located at [facility].   Joan Flores, NP   Subjective:   Patient ID:  Christopher George is a 25 y.o. (DOB 12-04-1995) male.  Chief Complaint:  Chief Complaint  Patient presents with   Depression   Anxiety   Medication Refill   Follow-up    HPI Christopher George presents for follow-up  and medication refill. 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 He says that he feels much better emotionally. He feels like reducing the gabapentin to lower levels has really helped his fatigue and mental fogginess . Says his anxiety level right now is 3/10 and depression is 2/10. He is sleeping 7-8 hours per night. Does not want to make any medication changes at this time and is requesting 3 month follow up. No mania, no psychosis, no SI/HI.      Past Psychiatric  Medication trials:   Wellbutrin Sertraline Dextroamphetamine Strattera Duloxetine Seroquel Abilify Amoxetine Depakote Hydroxyzine Prozac Temazepam  Buspar   Review of Systems:  Review of Systems  Medications: I have reviewed the patient's current medications.  Current Outpatient Medications  Medication Sig Dispense Refill   albuterol (VENTOLIN HFA) 108 (90 Base) MCG/ACT inhaler Inhale 1-2 puffs into the lungs every 6 (six) hours as needed for up to 10 days for wheezing or shortness of breath. 1 g 0   clonazePAM (KLONOPIN) 0.5 MG tablet Take 1 tablet (0.5 mg total) by mouth 2 (two) times daily as needed. 60 tablet 0   gabapentin (NEURONTIN) 100 MG capsule Take 2 capsules (200 mg total) by mouth 2 (two) times daily. 120 capsule 2   gabapentin (NEURONTIN) 300 MG capsule Take 1 capsule (300 mg total) by mouth 2 (two) times daily. 60 capsule 0   lamoTRIgine (LAMICTAL) 100 MG tablet TAKE 1 TABLET BY MOUTH EVERY DAY 90 tablet 0   lamoTRIgine (LAMICTAL) 25 MG tablet Take one tablet 25 mg for 14 days, then take two tablets 50 mg daily. 60 tablet 1   sertraline (ZOLOFT) 100 MG tablet TAKE 1 AND 1/2 TABLETS BY MOUTH EVERY DAY 45 tablet 0   traZODone (DESYREL) 50 MG tablet Take 1/2 tablet 25 mg at bedtime. 30 tablet 2   No current facility-administered medications for this visit.    Medication Side Effects: None  Allergies: No Known Allergies  Past Medical History:  Diagnosis Date  Acid reflux    occasional   Allergy    High cholesterol    no current med.   Inguinal hernia 11/2014   right   Vision abnormalities    wears glasses    Family History  Problem Relation Age of Onset   Heart disease Paternal Grandfather    Antithrombin III deficiency Paternal Grandfather    Hypertension Father    Healthy Mother     Social History   Socioeconomic History   Marital status: Single    Spouse name: Not on file   Number of children: Not on file   Years of education: 16    Highest education level: Associate degree: academic program  Occupational History   Occupation: Theme park manager    Comment: Engineer, mining in [place]  Tobacco Use   Smoking status: Never   Smokeless tobacco: Never  Vaping Use   Vaping Use: Never used  Substance and Sexual Activity   Alcohol use: No   Drug use: No   Sexual activity: Never  Other Topics Concern   Not on file  Social History Narrative   Currently [employer]worth of church in [place].  Lives with parents until parsonage is ready. Engaged and planning    On getting married in November.    Social Determinants of Health   Financial Resource Strain: Not on file  Food Insecurity: Not on file  Transportation Needs: Not on file  Physical Activity: Not on file  Stress: Not on file  Social Connections: Not on file  Intimate Partner Violence: Not on file    Past Medical History, Surgical history, Social history, and Family history were reviewed and updated as appropriate.   Please see review of systems for further details on the patient's review from today.   Objective:   Physical Exam:  There were no vitals taken for this visit.  Physical Exam  Lab Review:  No results found for: NA, K, CL, CO2, GLUCOSE, BUN, CREATININE, CALCIUM, PROT, ALBUMIN, AST, ALT, ALKPHOS, BILITOT, GFRNONAA, GFRAA  No results found for: WBC, RBC, HGB, HCT, PLT, MCV, MCH, MCHC, RDW, LYMPHSABS, MONOABS, EOSABS, BASOSABS  No results found for: POCLITH, LITHIUM   No results found for: PHENYTOIN, PHENOBARB, VALPROATE, CBMZ   .res Assessment: Plan:    Makayla was seen today for depression, anxiety, medication refill and follow-up.  Diagnoses and all orders for this visit:  Generalized anxiety disorder  Major depressive disorder, recurrent episode, moderate (HCC)  Unspecified mood (affective) disorder (HCC)    Increase Zoloft to 150 mg daily Increase Lamictal to 100 mg daily Continue Gabapentin to 200 mg 2  times daily 400 mg total daily  until next follow up. Continue Trazodone 50 mg at bedtime daily.  Continue Klonopin 0.5 mg twice daily prn Will report any worsening symptoms or side effects Will follow up in 3 months to reassess. Greater than 50% of 20 min face to face time with patient was spent on counseling and coordination of care. We reviewed his complex history of anxiety, depression, and fatigue stemming back several years. I reinforced my clinical judgement that his Rx of Gabapentin combined with Klonopin can be causing his symptoms of mental fog and fatigue. He agrees that his mental fogginess and fatigue is much improved since reducing daily dose.  Continue to not rule out DX of Bipolar disorder.  Monitor for any sign of rash. Please taking Lamictal and contact office immediately rash develops. Recommend seeking urgent medical attention if rash is severe and/or spreading  quickly. Recommend psychotherapy and psych testing. PDMP reviewed     Elwanda Brooklyn, NP               Please see After Visit Summary for patient specific instructions.  No future appointments.  No orders of the defined types were placed in this encounter.     -------------------------------

## 2021-10-23 ENCOUNTER — Other Ambulatory Visit: Payer: Self-pay | Admitting: Behavioral Health

## 2021-10-23 DIAGNOSIS — F411 Generalized anxiety disorder: Secondary | ICD-10-CM

## 2021-10-23 DIAGNOSIS — F39 Unspecified mood [affective] disorder: Secondary | ICD-10-CM

## 2021-10-23 DIAGNOSIS — F331 Major depressive disorder, recurrent, moderate: Secondary | ICD-10-CM

## 2021-10-25 ENCOUNTER — Other Ambulatory Visit: Payer: Self-pay | Admitting: Behavioral Health

## 2021-10-25 DIAGNOSIS — F331 Major depressive disorder, recurrent, moderate: Secondary | ICD-10-CM

## 2021-10-25 DIAGNOSIS — F411 Generalized anxiety disorder: Secondary | ICD-10-CM

## 2021-10-25 DIAGNOSIS — F39 Unspecified mood [affective] disorder: Secondary | ICD-10-CM

## 2021-11-16 ENCOUNTER — Other Ambulatory Visit: Payer: Self-pay | Admitting: Behavioral Health

## 2021-11-16 DIAGNOSIS — F5105 Insomnia due to other mental disorder: Secondary | ICD-10-CM

## 2021-11-16 DIAGNOSIS — F39 Unspecified mood [affective] disorder: Secondary | ICD-10-CM

## 2021-11-16 DIAGNOSIS — F331 Major depressive disorder, recurrent, moderate: Secondary | ICD-10-CM

## 2021-11-16 DIAGNOSIS — F99 Mental disorder, not otherwise specified: Secondary | ICD-10-CM

## 2021-11-16 DIAGNOSIS — F411 Generalized anxiety disorder: Secondary | ICD-10-CM

## 2021-11-17 NOTE — Telephone Encounter (Signed)
Pt requesting refill for [facility]apentin and [facility]epam @ [facility] [address] Apt [facility].

## 2021-11-17 NOTE — Telephone Encounter (Signed)
Appt 2/21

## 2021-11-18 MED ORDER — CLONAZEPAM 0.5 MG PO TABS: 0.5000 mg | ORAL_TABLET | Freq: Two times a day (BID) | ORAL | 1 refills | Status: DC | PRN

## 2021-12-04 ENCOUNTER — Other Ambulatory Visit: Payer: Self-pay | Admitting: Behavioral Health

## 2021-12-04 DIAGNOSIS — F5105 Insomnia due to other mental disorder: Secondary | ICD-10-CM

## 2021-12-04 DIAGNOSIS — F99 Mental disorder, not otherwise specified: Secondary | ICD-10-CM

## 2021-12-07 ENCOUNTER — Other Ambulatory Visit: Payer: Self-pay | Admitting: Behavioral Health

## 2021-12-07 DIAGNOSIS — F5105 Insomnia due to other mental disorder: Secondary | ICD-10-CM

## 2022-01-05 ENCOUNTER — Telehealth: Payer: BC Managed Care – PPO | Admitting: Behavioral Health

## 2022-01-15 ENCOUNTER — Other Ambulatory Visit: Payer: Self-pay | Admitting: Behavioral Health

## 2022-01-15 DIAGNOSIS — F331 Major depressive disorder, recurrent, moderate: Secondary | ICD-10-CM

## 2022-01-15 DIAGNOSIS — F39 Unspecified mood [affective] disorder: Secondary | ICD-10-CM

## 2022-01-15 DIAGNOSIS — F411 Generalized anxiety disorder: Secondary | ICD-10-CM

## 2022-01-15 DIAGNOSIS — F99 Mental disorder, not otherwise specified: Secondary | ICD-10-CM

## 2022-01-15 DIAGNOSIS — F5105 Insomnia due to other mental disorder: Secondary | ICD-10-CM

## 2022-01-19 ENCOUNTER — Other Ambulatory Visit: Payer: Self-pay

## 2022-01-19 DIAGNOSIS — F411 Generalized anxiety disorder: Secondary | ICD-10-CM

## 2022-01-19 MED ORDER — CLONAZEPAM 0.5 MG PO TABS: ORAL_TABLET | ORAL | 0 refills | Status: DC

## 2022-02-15 ENCOUNTER — Other Ambulatory Visit: Payer: Self-pay | Admitting: Behavioral Health

## 2022-02-15 DIAGNOSIS — F411 Generalized anxiety disorder: Secondary | ICD-10-CM

## 2022-03-07 ENCOUNTER — Other Ambulatory Visit: Payer: Self-pay | Admitting: Behavioral Health

## 2022-03-07 DIAGNOSIS — F5105 Insomnia due to other mental disorder: Secondary | ICD-10-CM

## 2022-03-07 DIAGNOSIS — F99 Mental disorder, not otherwise specified: Secondary | ICD-10-CM

## 2022-03-07 NOTE — Telephone Encounter (Signed)
Please call to schedule an appt  

## 2022-03-08 NOTE — Telephone Encounter (Signed)
Voicemail if full unable tol vm ?

## 2022-04-03 ENCOUNTER — Other Ambulatory Visit: Payer: Self-pay | Admitting: Behavioral Health

## 2022-04-03 DIAGNOSIS — F5105 Insomnia due to other mental disorder: Secondary | ICD-10-CM

## 2022-04-04 NOTE — Telephone Encounter (Signed)
Please call to schedule an appt, was due in Feb. 

## 2022-04-07 NOTE — Telephone Encounter (Signed)
Please call to schedule an appt, was due in Feb.

## 2022-04-07 NOTE — Telephone Encounter (Signed)
LVM for pt to schedule.

## 2022-04-09 ENCOUNTER — Other Ambulatory Visit: Payer: Self-pay | Admitting: Behavioral Health

## 2022-04-09 DIAGNOSIS — F5105 Insomnia due to other mental disorder: Secondary | ICD-10-CM

## 2022-04-15 ENCOUNTER — Other Ambulatory Visit: Payer: Self-pay | Admitting: Behavioral Health

## 2022-04-15 DIAGNOSIS — F411 Generalized anxiety disorder: Secondary | ICD-10-CM

## 2022-04-15 DIAGNOSIS — F331 Major depressive disorder, recurrent, moderate: Secondary | ICD-10-CM

## 2022-04-15 DIAGNOSIS — F39 Unspecified mood [affective] disorder: Secondary | ICD-10-CM

## 2024-07-26 ENCOUNTER — Emergency Department
Admission: EM | Admit: 2024-07-26 | Discharge: 2024-07-26 | Disposition: A | Discharge status: Home / Self Care | Payer: Self-pay | Source: Ambulatory Visit | Attending: Emergency Medicine | Admitting: Emergency Medicine

## 2024-07-26 ENCOUNTER — Other Ambulatory Visit: Payer: Self-pay

## 2024-07-26 DIAGNOSIS — R1110 Vomiting, unspecified: Secondary | ICD-10-CM | POA: Insufficient documentation

## 2024-07-26 DIAGNOSIS — Z5321 Procedure and treatment not carried out due to patient leaving prior to being seen by health care provider: Secondary | ICD-10-CM | POA: Insufficient documentation

## 2024-07-26 DIAGNOSIS — R197 Diarrhea, unspecified: Secondary | ICD-10-CM | POA: Insufficient documentation

## 2024-07-26 DIAGNOSIS — R109 Unspecified abdominal pain: Secondary | ICD-10-CM | POA: Insufficient documentation

## 2024-07-26 LAB — COMPREHENSIVE METABOLIC PANEL WITH GFR
ALT: 33 U/L (ref 0–44)
AST: 20 U/L (ref 15–41)
Albumin: 3.8 g/dL (ref 3.5–5.0)
Alkaline Phosphatase: 59 U/L (ref 38–126)
Anion gap: 9 (ref 5–15)
BUN: 13 mg/dL (ref 6–20)
CO2: 20 mmol/L — ABNORMAL LOW (ref 22–32)
Calcium: 9.1 mg/dL (ref 8.9–10.3)
Chloride: 107 mmol/L (ref 98–111)
Creatinine, Ser: 0.85 mg/dL (ref 0.61–1.24)
GFR, Estimated: >60 mL/min (ref >60)
Glucose, Bld: 105 mg/dL — ABNORMAL HIGH (ref 70–99)
Potassium: 3.5 mmol/L (ref 3.5–5.1)
Sodium: 136 mmol/L (ref 135–145)
Total Bilirubin: 0.6 mg/dL (ref 0.0–1.2)
Total Protein: 7.2 g/dL (ref 6.5–8.1)

## 2024-07-26 LAB — CBC
HCT: 46 % (ref 39.0–52.0)
Hemoglobin: 15.7 g/dL (ref 13.0–17.0)
MCH: 28.1 pg (ref 26.0–34.0)
MCHC: 34.1 g/dL (ref 30.0–36.0)
MCV: 82.3 fL (ref 80.0–100.0)
Platelets: 255 K/uL (ref 150–400)
RBC: 5.59 MIL/uL (ref 4.22–5.81)
RDW: 14.4 % (ref 11.5–15.5)
WBC: 8.9 K/uL (ref 4.0–10.5)
nRBC: 0 % (ref 0.0–0.2)

## 2024-07-26 LAB — LIPASE, BLOOD: Lipase: 31 U/L (ref 11–51)

## 2024-07-26 NOTE — ED Triage Notes (Signed)
 Pt comes in via pov with complaints of abdominal pain diarrhea and vomiting. PT was recently diagnosed with diverticulitis and is currently on an antibiotic. Pt has noticed in increase in pain, diarrhea and vomiting. Pt's last episode of diarrhea was this morning, last episode of vomiting was yesterday morning. PT reports being extremely sore in the abdominal area. Pt states that his household is just getting over a stomach bug as well. Pt complains of pain 7/10.
